# Patient Record
Sex: Female | Born: 1945 | Race: Black or African American | Hispanic: No | Smoking: Never smoker
Health system: Southern US, Community
[De-identification: ages and names within clinical notes are randomized; demographics above are authoritative.]

## PROBLEM LIST (undated history)

## (undated) DIAGNOSIS — Z973 Presence of spectacles and contact lenses: Secondary | ICD-10-CM

## (undated) DIAGNOSIS — M810 Age-related osteoporosis without current pathological fracture: Secondary | ICD-10-CM

## (undated) DIAGNOSIS — E785 Hyperlipidemia, unspecified: Secondary | ICD-10-CM

## (undated) DIAGNOSIS — C801 Malignant (primary) neoplasm, unspecified: Secondary | ICD-10-CM

## (undated) HISTORY — PX: COLONOSCOPY: SHX174

## (undated) HISTORY — DX: Hyperlipidemia, unspecified: E78.5

## (undated) HISTORY — DX: Age-related osteoporosis without current pathological fracture: M81.0

---

## 1983-07-03 HISTORY — PX: BREAST SURGERY: SHX581

## 2003-07-03 HISTORY — PX: PATELLA FRACTURE SURGERY: SHX735

## 2006-07-02 HISTORY — PX: ORIF HIP FRACTURE: SHX2125

## 2013-06-19 ENCOUNTER — Encounter: Payer: Self-pay | Admitting: Nurse Practitioner

## 2013-08-06 ENCOUNTER — Ambulatory Visit (INDEPENDENT_AMBULATORY_CARE_PROVIDER_SITE_OTHER): Payer: BC Managed Care – PPO | Admitting: General Surgery

## 2013-08-27 ENCOUNTER — Ambulatory Visit (INDEPENDENT_AMBULATORY_CARE_PROVIDER_SITE_OTHER): Payer: BC Managed Care – PPO | Admitting: General Surgery

## 2013-08-28 ENCOUNTER — Ambulatory Visit (INDEPENDENT_AMBULATORY_CARE_PROVIDER_SITE_OTHER): Payer: BC Managed Care – PPO | Admitting: General Surgery

## 2013-11-16 ENCOUNTER — Other Ambulatory Visit: Payer: Self-pay | Admitting: Family Medicine

## 2013-11-16 DIAGNOSIS — R921 Mammographic calcification found on diagnostic imaging of breast: Secondary | ICD-10-CM

## 2013-12-09 ENCOUNTER — Ambulatory Visit
Admission: RE | Admit: 2013-12-09 | Discharge: 2013-12-09 | Disposition: A | Discharge status: Home / Self Care | Payer: BC Managed Care – PPO | Source: Ambulatory Visit | Attending: Family Medicine | Admitting: Family Medicine

## 2013-12-09 DIAGNOSIS — R921 Mammographic calcification found on diagnostic imaging of breast: Secondary | ICD-10-CM

## 2013-12-10 ENCOUNTER — Other Ambulatory Visit: Payer: Self-pay | Admitting: Family Medicine

## 2013-12-10 DIAGNOSIS — N6321 Unspecified lump in the left breast, upper outer quadrant: Secondary | ICD-10-CM

## 2013-12-18 ENCOUNTER — Ambulatory Visit
Admission: RE | Admit: 2013-12-18 | Discharge: 2013-12-18 | Disposition: A | Discharge status: Home / Self Care | Payer: BC Managed Care – PPO | Source: Ambulatory Visit | Attending: Family Medicine | Admitting: Family Medicine

## 2013-12-18 ENCOUNTER — Encounter (INDEPENDENT_AMBULATORY_CARE_PROVIDER_SITE_OTHER): Payer: Self-pay

## 2013-12-18 ENCOUNTER — Other Ambulatory Visit: Payer: Self-pay | Admitting: Family Medicine

## 2013-12-18 DIAGNOSIS — N6321 Unspecified lump in the left breast, upper outer quadrant: Secondary | ICD-10-CM

## 2013-12-30 ENCOUNTER — Other Ambulatory Visit: Payer: BC Managed Care – PPO

## 2013-12-31 ENCOUNTER — Ambulatory Visit
Admission: RE | Admit: 2013-12-31 | Discharge: 2013-12-31 | Disposition: A | Discharge status: Home / Self Care | Payer: BC Managed Care – PPO | Source: Ambulatory Visit | Attending: Family Medicine | Admitting: Family Medicine

## 2013-12-31 DIAGNOSIS — N6321 Unspecified lump in the left breast, upper outer quadrant: Secondary | ICD-10-CM

## 2014-01-06 ENCOUNTER — Ambulatory Visit (INDEPENDENT_AMBULATORY_CARE_PROVIDER_SITE_OTHER): Payer: BC Managed Care – PPO | Admitting: General Surgery

## 2014-01-06 ENCOUNTER — Encounter (INDEPENDENT_AMBULATORY_CARE_PROVIDER_SITE_OTHER): Payer: Self-pay | Admitting: General Surgery

## 2014-01-06 VITALS — BP 126/80 | HR 85 | Temp 97.9°F | Ht 64.0 in | Wt 148.0 lb

## 2014-01-06 DIAGNOSIS — R928 Other abnormal and inconclusive findings on diagnostic imaging of breast: Secondary | ICD-10-CM

## 2014-01-06 DIAGNOSIS — D242 Benign neoplasm of left breast: Secondary | ICD-10-CM

## 2014-01-06 DIAGNOSIS — D249 Benign neoplasm of unspecified breast: Secondary | ICD-10-CM

## 2014-01-06 NOTE — Progress Notes (Signed)
Chief complaint: Abnormal mammogram left breast the previous biopsy left breast showing papilloma  History: Patient is a 68 year old female referred by Dr. Luberta Robertson. In January of this year at [facility] she had an abnormal mammogram and ultrasound and underwent an ultrasound-guided core biopsy which revealed a papilloma. Surgical excision was recommended at that time but declined by the patient. She recently presented to the breast center for a second opinion. She has a history of breast cancer family and her sister at age 13. She has undergone previous benign left breast biopsy many years ago.  The patient subsequently underwent ultrasound of the left breast showing a clip within the subareolar portion of the left breast related to the previous biopsy. However also noted was a near regular mass located in the middle third of the breast subareolar portion measuring 1.3 cm in size and approximately 2 cm posterior to the previously placed clip. Ultrasound guided core biopsy of this mass was recommended and performed. Pathology revealed benign breast tissue negative for malignancy with calcifications. However post biopsy mammogram shows that the biopsy clip coil shaped is located 1.5 cm anterior to the noted area of density. There is concern that the area was not adequately sampled.  Past Medical History  Diagnosis Date  . Hyperlipidemia   . Osteoporosis    Past Surgical History  Procedure Laterality Date  . Knee arthrocentesis    . Hip adductor tenotomy     Current Outpatient Prescriptions  Medication Sig Dispense Refill  . CRESTOR 20 MG tablet        No current facility-administered medications for this visit.   No Known Allergies History  Substance Use Topics  . Smoking status: Never Smoker   . Smokeless tobacco: Not on file  . Alcohol Use: No   Exam: BP 126/80  Pulse 85  Temp(Src) 97.9 F (36.6 C)  Ht 5\' 4"  (1.626 m)  Wt 148 lb (67.132 kg)  BMI 25.39 kg/m2 General:  Well-developed Afro-American female no distress Skin: No rash or infection HEENT: No pelvic masses. No icterus.  Lungs: Clear good breath sounds without wheezing Cardiac: Regular rate and rhythm without murmurs Lymph nodes: No cervical, supraclavicular or axillary nodes palpable Breasts: Healed incision lower left breast. Slight bruising at the lateral core biopsy site left breast. No palpable masses or nipple inversion or nipple discharge.  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 Followup imaging also shows a suspicious area of density 2 cm posterior to this that may not have been adequately sampled based on post biopsy clip placement. I recommend proceeding with needle localized lumpectomy with bracketing to excise both of these areas for diagnosis. We discussed the indication for the procedure and its nature in recovery as well as risks of anesthetic complications, bleeding, infection and possible pathology findings. We discussed that if we did find malignancy that this surgery would likely not be definitive. She understands and agrees and all her questions were answered. We will schedule her convenience.

## 2014-01-06 NOTE — Patient Instructions (Signed)
We will call in the next one to 2 days to schedule your breast lumpectomy.Marland Kitchen

## 2014-01-11 ENCOUNTER — Other Ambulatory Visit (INDEPENDENT_AMBULATORY_CARE_PROVIDER_SITE_OTHER): Payer: Self-pay | Admitting: General Surgery

## 2014-01-11 DIAGNOSIS — R928 Other abnormal and inconclusive findings on diagnostic imaging of breast: Secondary | ICD-10-CM

## 2014-02-02 ENCOUNTER — Encounter (HOSPITAL_BASED_OUTPATIENT_CLINIC_OR_DEPARTMENT_OTHER): Payer: Self-pay | Admitting: *Deleted

## 2014-02-02 NOTE — Progress Notes (Signed)
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

## 2014-02-08 ENCOUNTER — Ambulatory Visit (HOSPITAL_BASED_OUTPATIENT_CLINIC_OR_DEPARTMENT_OTHER)
Admission: RE | Admit: 2014-02-08 | Discharge: 2014-02-08 | Disposition: A | Discharge status: Home / Self Care | Payer: BC Managed Care – PPO | Source: Ambulatory Visit | Attending: General Surgery | Admitting: General Surgery

## 2014-02-08 ENCOUNTER — Ambulatory Visit
Admission: RE | Admit: 2014-02-08 | Discharge: 2014-02-08 | Disposition: A | Discharge status: Home / Self Care | Payer: BC Managed Care – PPO | Source: Ambulatory Visit | Attending: General Surgery | Admitting: General Surgery

## 2014-02-08 ENCOUNTER — Ambulatory Visit (HOSPITAL_BASED_OUTPATIENT_CLINIC_OR_DEPARTMENT_OTHER): Payer: BC Managed Care – PPO | Admitting: Anesthesiology

## 2014-02-08 ENCOUNTER — Encounter (HOSPITAL_BASED_OUTPATIENT_CLINIC_OR_DEPARTMENT_OTHER)
Admission: RE | Disposition: A | Discharge status: Home / Self Care | Payer: Self-pay | Source: Ambulatory Visit | Attending: General Surgery

## 2014-02-08 ENCOUNTER — Encounter (HOSPITAL_BASED_OUTPATIENT_CLINIC_OR_DEPARTMENT_OTHER): Payer: Self-pay | Admitting: Anesthesiology

## 2014-02-08 ENCOUNTER — Encounter (HOSPITAL_BASED_OUTPATIENT_CLINIC_OR_DEPARTMENT_OTHER): Payer: BC Managed Care – PPO | Admitting: Anesthesiology

## 2014-02-08 DIAGNOSIS — D249 Benign neoplasm of unspecified breast: Secondary | ICD-10-CM | POA: Insufficient documentation

## 2014-02-08 DIAGNOSIS — R928 Other abnormal and inconclusive findings on diagnostic imaging of breast: Secondary | ICD-10-CM

## 2014-02-08 DIAGNOSIS — E785 Hyperlipidemia, unspecified: Secondary | ICD-10-CM | POA: Insufficient documentation

## 2014-02-08 DIAGNOSIS — D242 Benign neoplasm of left breast: Secondary | ICD-10-CM

## 2014-02-08 DIAGNOSIS — M810 Age-related osteoporosis without current pathological fracture: Secondary | ICD-10-CM | POA: Insufficient documentation

## 2014-02-08 DIAGNOSIS — Z79899 Other long term (current) drug therapy: Secondary | ICD-10-CM | POA: Diagnosis not present

## 2014-02-08 DIAGNOSIS — C50919 Malignant neoplasm of unspecified site of unspecified female breast: Secondary | ICD-10-CM

## 2014-02-08 HISTORY — PX: BREAST LUMPECTOMY WITH NEEDLE LOCALIZATION: SHX5759

## 2014-02-08 HISTORY — DX: Presence of spectacles and contact lenses: Z97.3

## 2014-02-08 LAB — POCT HEMOGLOBIN-HEMACUE: HEMOGLOBIN: 11.1 g/dL — AB (ref 12.0–15.0)

## 2014-02-08 SURGERY — BREAST LUMPECTOMY WITH NEEDLE LOCALIZATION
Anesthesia: General | Site: Breast | Laterality: Left

## 2014-02-08 MED ORDER — OXYCODONE HCL 5 MG PO TABS: 5.0000 mg | ORAL_TABLET | Freq: Four times a day (QID) | ORAL | Status: DC | PRN

## 2014-02-08 MED ORDER — MIDAZOLAM HCL 2 MG/2ML IJ SOLN
INTRAMUSCULAR | Status: AC
  Filled 2014-02-08: qty 2

## 2014-02-08 MED ORDER — SCOPOLAMINE 1 MG/3DAYS TD PT72
1.0000 | MEDICATED_PATCH | Freq: Once | TRANSDERMAL | Status: DC
  Administered 2014-02-08: 1.5 mg via TRANSDERMAL

## 2014-02-08 MED ORDER — BUPIVACAINE-EPINEPHRINE (PF) 0.5% -1:200000 IJ SOLN
INTRAMUSCULAR | Status: AC
  Filled 2014-02-08: qty 30

## 2014-02-08 MED ORDER — PROPOFOL 10 MG/ML IV BOLUS
INTRAVENOUS | Status: DC | PRN
  Administered 2014-02-08: 120 mg via INTRAVENOUS

## 2014-02-08 MED ORDER — DEXAMETHASONE SODIUM PHOSPHATE 4 MG/ML IJ SOLN
INTRAMUSCULAR | Status: DC | PRN
  Administered 2014-02-08: 10 mg via INTRAVENOUS

## 2014-02-08 MED ORDER — FENTANYL CITRATE 0.05 MG/ML IJ SOLN
INTRAMUSCULAR | Status: AC
  Filled 2014-02-08: qty 4

## 2014-02-08 MED ORDER — SCOPOLAMINE 1 MG/3DAYS TD PT72
MEDICATED_PATCH | TRANSDERMAL | Status: AC
  Filled 2014-02-08: qty 1

## 2014-02-08 MED ORDER — MIDAZOLAM HCL 2 MG/2ML IJ SOLN: 1.0000 mg | INTRAMUSCULAR | Status: DC | PRN

## 2014-02-08 MED ORDER — HYDROMORPHONE HCL PF 1 MG/ML IJ SOLN
0.2500 mg | INTRAMUSCULAR | Status: DC | PRN
Start: 2014-02-08 — End: 2014-02-08

## 2014-02-08 MED ORDER — FENTANYL CITRATE 0.05 MG/ML IJ SOLN: 50.0000 ug | INTRAMUSCULAR | Status: DC | PRN

## 2014-02-08 MED ORDER — LIDOCAINE HCL (PF) 1 % IJ SOLN
INTRAMUSCULAR | Status: AC
  Filled 2014-02-08: qty 30

## 2014-02-08 MED ORDER — LACTATED RINGERS IV SOLN
INTRAVENOUS | Status: DC
  Administered 2014-02-08: 11:00:00 via INTRAVENOUS

## 2014-02-08 MED ORDER — LIDOCAINE HCL (CARDIAC) 20 MG/ML IV SOLN
INTRAVENOUS | Status: DC | PRN
  Administered 2014-02-08: 60 mg via INTRAVENOUS

## 2014-02-08 MED ORDER — CEFAZOLIN SODIUM-DEXTROSE 2-3 GM-% IV SOLR
INTRAVENOUS | Status: AC
  Filled 2014-02-08: qty 50

## 2014-02-08 MED ORDER — MIDAZOLAM HCL 5 MG/5ML IJ SOLN
INTRAMUSCULAR | Status: DC | PRN
  Administered 2014-02-08: 1 mg via INTRAVENOUS

## 2014-02-08 MED ORDER — BUPIVACAINE-EPINEPHRINE 0.5% -1:200000 IJ SOLN
INTRAMUSCULAR | Status: DC | PRN
Start: 2014-02-08 — End: 2014-02-08
  Administered 2014-02-08: 27 mL

## 2014-02-08 MED ORDER — CEFAZOLIN SODIUM-DEXTROSE 2-3 GM-% IV SOLR
2.0000 g | INTRAVENOUS | Status: AC
  Administered 2014-02-08: 2 g via INTRAVENOUS

## 2014-02-08 MED ORDER — FENTANYL CITRATE 0.05 MG/ML IJ SOLN
INTRAMUSCULAR | Status: DC | PRN
  Administered 2014-02-08: 25 ug via INTRAVENOUS
  Administered 2014-02-08: 50 ug via INTRAVENOUS
  Administered 2014-02-08: 25 ug via INTRAVENOUS

## 2014-02-08 MED ORDER — ONDANSETRON HCL 4 MG/2ML IJ SOLN
INTRAMUSCULAR | Status: DC | PRN
  Administered 2014-02-08: 4 mg via INTRAVENOUS

## 2014-02-08 SURGICAL SUPPLY — 48 items
BINDER BREAST LRG (GAUZE/BANDAGES/DRESSINGS) ×2 IMPLANT
BLADE SURG 10 STRL SS (BLADE) IMPLANT
BLADE SURG 15 STRL LF DISP TIS (BLADE) ×1 IMPLANT
BLADE SURG 15 STRL SS (BLADE) ×1
CANISTER SUCT 1200ML W/VALVE (MISCELLANEOUS) IMPLANT
CHLORAPREP W/TINT 26ML (MISCELLANEOUS) ×2 IMPLANT
CLIP TI MEDIUM 6 (CLIP) IMPLANT
CLIP TI WIDE RED SMALL 6 (CLIP) IMPLANT
COVER MAYO STAND STRL (DRAPES) ×2 IMPLANT
COVER TABLE BACK 60X90 (DRAPES) ×2 IMPLANT
DERMABOND ADVANCED (GAUZE/BANDAGES/DRESSINGS) ×1
DERMABOND ADVANCED .7 DNX12 (GAUZE/BANDAGES/DRESSINGS) ×1 IMPLANT
DEVICE DUBIN W/COMP PLATE 8390 (MISCELLANEOUS) ×2 IMPLANT
DRAPE PED LAPAROTOMY (DRAPES) ×2 IMPLANT
DRAPE UTILITY XL STRL (DRAPES) ×2 IMPLANT
ELECT COATED BLADE 2.86 ST (ELECTRODE) ×2 IMPLANT
ELECT REM PT RETURN 9FT ADLT (ELECTROSURGICAL) ×2
ELECTRODE REM PT RTRN 9FT ADLT (ELECTROSURGICAL) ×1 IMPLANT
GLOVE BIOGEL PI IND STRL 7.0 (GLOVE) ×1 IMPLANT
GLOVE BIOGEL PI IND STRL 8 (GLOVE) ×1 IMPLANT
GLOVE BIOGEL PI INDICATOR 7.0 (GLOVE) ×1
GLOVE BIOGEL PI INDICATOR 8 (GLOVE) ×1
GLOVE ECLIPSE 7.0 STRL STRAW (GLOVE) ×2 IMPLANT
GLOVE SS BIOGEL STRL SZ 7.5 (GLOVE) ×1 IMPLANT
GLOVE SUPERSENSE BIOGEL SZ 7.5 (GLOVE) ×1
GOWN STRL REUS W/ TWL LRG LVL3 (GOWN DISPOSABLE) ×1 IMPLANT
GOWN STRL REUS W/ TWL XL LVL3 (GOWN DISPOSABLE) ×1 IMPLANT
GOWN STRL REUS W/TWL LRG LVL3 (GOWN DISPOSABLE) ×1
GOWN STRL REUS W/TWL XL LVL3 (GOWN DISPOSABLE) ×1
KIT MARKER MARGIN INK (KITS) IMPLANT
NEEDLE HYPO 25X1 1.5 SAFETY (NEEDLE) ×2 IMPLANT
NS IRRIG 1000ML POUR BTL (IV SOLUTION) ×2 IMPLANT
PACK BASIN DAY SURGERY FS (CUSTOM PROCEDURE TRAY) ×2 IMPLANT
PENCIL BUTTON HOLSTER BLD 10FT (ELECTRODE) ×2 IMPLANT
SLEEVE SCD COMPRESS KNEE MED (MISCELLANEOUS) IMPLANT
STAPLER VISISTAT 35W (STAPLE) IMPLANT
SUT MON AB 3-0 SH 27 (SUTURE)
SUT MON AB 3-0 SH27 (SUTURE) IMPLANT
SUT MON AB 5-0 PS2 18 (SUTURE) ×2 IMPLANT
SUT SILK 3 0 SH 30 (SUTURE) IMPLANT
SUT VIC AB 4-0 BRD 54 (SUTURE) IMPLANT
SUT VICRYL 3-0 CR8 SH (SUTURE) ×2 IMPLANT
SYR BULB 3OZ (MISCELLANEOUS) IMPLANT
SYR CONTROL 10ML LL (SYRINGE) ×2 IMPLANT
TOWEL OR 17X24 6PK STRL BLUE (TOWEL DISPOSABLE) ×4 IMPLANT
TOWEL OR NON WOVEN STRL DISP B (DISPOSABLE) ×2 IMPLANT
TUBE CONNECTING 20X1/4 (TUBING) IMPLANT
YANKAUER SUCT BULB TIP NO VENT (SUCTIONS) IMPLANT

## 2014-02-08 NOTE — Transfer of Care (Signed)
Immediate Anesthesia Transfer of Care Note  Patient: Dana Burton  Procedure(s) Performed: Procedure(s): BREAST LUMPECTOMY WITH NEEDLE LOCALIZATION (Left)  Patient Location: PACU  Anesthesia Type:General  Level of Consciousness: awake and sedated  Airway & Oxygen Therapy: Patient Spontanous Breathing and Patient connected to face mask oxygen  Post-op Assessment: Report given to PACU RN and Post -op Vital signs reviewed and stable  Post vital signs: Reviewed and stable  Complications: No apparent anesthesia complications

## 2014-02-08 NOTE — Op Note (Signed)
Preoperative Diagnosis: papilloma left breast And abnormal left breast mammogram  Postoprative Diagnosis: papilloma left breast And abnormal left breast mammogram  Procedure: Procedure(s): BREAST LUMPECTOMY WITH NEEDLE LOCALIZATION   Surgeon: Excell Seltzer T   Assistants: None  Anesthesia:  General LMA anesthesia  Indications: Patient is a 68 year old female who on a recent screening mammogram was found to have a small mass behind the left nipple. A large core needle biopsy was recommended revealing papilloma. On subsequent followup she was also found to have a  New abnormal density in a couple centimeters posterior to this. Image guided biopsy was recommended and performed showing benign breast tissue this was felt to be discordant with the marker clip about a centimeter anterior to the abnormal density. These findings after consultation with the breast radiologist we have recommended proceeding with needle localized excisional biopsy bracketing the anterior papilloma and the posterior area of abnormal density. We discussed the indications for the procedure and its nature as well as risks of anesthetic complications, bleeding, infection and possible need for further surgery based on final pathology.  Procedure Detail:  Following accurate needle localization bracketing the anterior papilloma and posterior abnormal density the patient was brought to the operating room, placed in the supine position on the operating table and laryngeal mask anesthesia induced. The left breast was widely sterilely prepped and draped. She received preoperative IV antibiotics. Patient timeout was performed and the procedure verified. I made a curvilinear incision at the areolar border upper-outer quadrant of left breast dissection was then carried behind the nipple areolar complex staying just anterior to the anterior wire where there was a definite palpable abnormality from either a small mass or biopsy change just  at the tip of the wire. The dissection was deepened down around the tip of the wire. I then raised a skin and subcutaneous flap out laterally to the more posterior wire which was encountered. This wire was then dissected down into breast tissue until the thickened area of the wire was in exposed. I then deepened the dissection from the anterior wire down around the tip and shaft of the posterior wire on either side with cautery encompassing all tissue between. There was definite firm tissue just anterior to the posteriorly placed wire. The specimen was removed. It was oriented with sutures. The specimen mammogram showed both intact wires and marking clips within the specimen. There was a little more firm tissue on the posterior wall of the cavity which I then excised with cautery and sent as a separate specimen. The wound was thoroughly irrigated and hemostasis obtained with cautery. The breast tissue and subcutaneous tissue was closed with interrupted 3-0 Vicryl and the skin closed with subcuticular 4-0 Monocryl and Dermabond. Sponge needle and instrument counts were correct.    Findings: As above  Estimated Blood Loss:  Minimal         Drains: none  Blood Given: none          Specimens: #1 left breast lumpectomy(papilloma anterior and abnormal density posterior)      #2 further posterior tissue        Complications:  * No complications entered in OR log *         Disposition: PACU - hemodynamically stable.         Condition: stable

## 2014-02-08 NOTE — H&P (Signed)
  Chief complaint: Abnormal mammogram left breast the previous biopsy left breast showing papilloma  History: Patient is a 68 year old female referred by Dr. Luberta Robertson. In January of this year at [facility] she had an abnormal mammogram and ultrasound and underwent an ultrasound-guided core biopsy which revealed a papilloma. Surgical excision was recommended at that time but declined by the patient. She recently presented to the breast center for a second opinion. She has a history of breast cancer family and her sister at age 33. She has undergone previous benign left breast biopsy many years ago.  The patient subsequently underwent ultrasound of the left breast showing a clip within the subareolar portion of the left breast related to the previous biopsy. However also noted was a near regular mass located in the middle third of the breast subareolar portion measuring 1.3 cm in size and approximately 2 cm posterior to the previously placed clip. Ultrasound guided core biopsy of this mass was recommended and performed. Pathology revealed benign breast tissue negative for malignancy with calcifications. However post biopsy mammogram shows that the biopsy clip coil shaped is located 1.5 cm anterior to the noted area of density. There is concern that the area was not adequately sampled.  Past Medical History   Diagnosis  Date   .  Hyperlipidemia    .  Osteoporosis     Past Surgical History   Procedure  Laterality  Date   .  Knee arthrocentesis     .  Hip adductor tenotomy      Current Outpatient Prescriptions   Medication  Sig  Dispense  Refill   .  CRESTOR 20 MG tablet       No current facility-administered medications for this visit.   No Known Allergies  History   Substance Use Topics   .  Smoking status:  Never Smoker   .  Smokeless tobacco:  Not on file   .  Alcohol Use:  No   Exam:  BP 126/80  Pulse 85  Temp(Src) 97.9 F (36.6 C)  Ht 5\' 4"  (1.626 m)  Wt 148 lb (67.132 kg)  BMI 25.39  kg/m2  General: Well-developed Afro-American female no distress  Skin: No rash or infection  HEENT: No pelvic masses. No icterus.  Lungs: Clear good breath sounds without wheezing  Cardiac: Regular rate and rhythm without murmurs  Lymph nodes: No cervical, supraclavicular or axillary nodes palpable  Breasts: Healed incision lower left breast. Slight bruising at the lateral core biopsy site left breast. No palpable masses or nipple inversion or nipple discharge.  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 Followup imaging also shows a suspicious area of density 2 cm posterior to this that may not have been adequately sampled based on post biopsy clip placement. I recommend proceeding with needle localized lumpectomy with bracketing to excise both of these areas for diagnosis. We discussed the indication for the procedure and its nature in recovery as well as risks of anesthetic complications, bleeding, infection and possible pathology findings. We discussed that if we did find malignancy that this surgery would likely not be definitive. She understands and agrees and all her questions were answered. We will schedule her convenience.  Edward Jolly MD, FACS  02/08/2014, 11:09 AM

## 2014-02-08 NOTE — Anesthesia Procedure Notes (Signed)
Procedure Name: LMA Insertion Performed by: Terrance Mass Pre-anesthesia Checklist: Patient identified, Timeout performed, Emergency Drugs available, Suction available and Patient being monitored Patient Re-evaluated:Patient Re-evaluated prior to inductionOxygen Delivery Method: Circle system utilized Preoxygenation: Pre-oxygenation with 100% oxygen Intubation Type: IV induction Ventilation: Mask ventilation without difficulty LMA: LMA inserted LMA Size: 4.0 Number of attempts: 1 Tube secured with: Tape Dental Injury: Teeth and Oropharynx as per pre-operative assessment

## 2014-02-08 NOTE — Anesthesia Postprocedure Evaluation (Signed)
  Anesthesia Post-op Note  Patient: Dana Burton  Procedure(s) Performed: Procedure(s): BREAST LUMPECTOMY WITH NEEDLE LOCALIZATION (Left)  Patient Location: PACU  Anesthesia Type:General  Level of Consciousness: awake and alert   Airway and Oxygen Therapy: Patient Spontanous Breathing  Post-op Pain: mild  Post-op Assessment: Post-op Vital signs reviewed, Patient's Cardiovascular Status Stable and Respiratory Function Stable  Post-op Vital Signs: Reviewed  Filed Vitals:   02/08/14 1345  BP: 147/81  Pulse: 78  Temp:   Resp: 16    Complications: No apparent anesthesia complications

## 2014-02-08 NOTE — Anesthesia Preprocedure Evaluation (Addendum)
Anesthesia Evaluation  Patient identified by MRN, date of birth, ID band Patient awake    Reviewed: Allergy & Precautions, H&P , NPO status , Patient's Chart, lab work & pertinent test results  Airway Mallampati: II  TM Distance: >3 FB Neck ROM: Full    Dental no notable dental hx. (+) Teeth Intact, Dental Advisory Given   Pulmonary neg pulmonary ROS,  breath sounds clear to auscultation  Pulmonary exam normal       Cardiovascular negative cardio ROS  Rhythm:Regular Rate:Normal     Neuro/Psych negative neurological ROS  negative psych ROS   GI/Hepatic negative GI ROS, Neg liver ROS,   Endo/Other  negative endocrine ROS  Renal/GU negative Renal ROS  negative genitourinary   Musculoskeletal   Abdominal   Peds  Hematology negative hematology ROS (+)   Anesthesia Other Findings   Reproductive/Obstetrics negative OB ROS                            Anesthesia Physical Anesthesia Plan  ASA: I  Anesthesia Plan: General   Post-op Pain Management:    Induction: Intravenous  Airway Management Planned: LMA  Additional Equipment:   Intra-op Plan:   Post-operative Plan: Extubation in OR  Informed Consent: I have reviewed the patients History and Physical, chart, labs and discussed the procedure including the risks, benefits and alternatives for the proposed anesthesia with the patient or authorized representative who has indicated his/her understanding and acceptance.   Dental advisory given  Plan Discussed with: CRNA  Anesthesia Plan Comments:         Anesthesia Quick Evaluation  

## 2014-02-08 NOTE — Discharge Instructions (Signed)
[facility] °Office Phone Number [phone] ° °BREAST BIOPSY/ PARTIAL MASTECTOMY: POST OP INSTRUCTIONS ° °Always review your discharge instruction sheet given to you by the facility where your surgery was performed. ° °IF YOU HAVE DISABILITY OR FAMILY LEAVE FORMS, YOU MUST BRING THEM TO THE OFFICE FOR PROCESSING.  DO NOT GIVE THEM TO YOUR DOCTOR. ° °1. A prescription for pain medication may be given to you upon discharge.  Take your pain medication as prescribed, if needed.  If narcotic pain medicine is not needed, then you may take acetaminophen (Tylenol) or ibuprofen (Advil) as needed. °2. Take your usually prescribed medications unless otherwise directed °3. If you need a refill on your pain medication, please contact your pharmacy.  They will contact our office to request authorization.  Prescriptions will not be filled after 5pm or on week-ends. °4. You should eat very light the first 24 hours after surgery, such as soup, crackers, pudding, etc.  Resume your normal diet the day after surgery. °5. Most patients will experience some swelling and bruising in the breast.  Ice packs and a good support bra will help.  Swelling and bruising can take several days to resolve.  °6. It is common to experience some constipation if taking pain medication after surgery.  Increasing fluid intake and taking a stool softener will usually help or prevent this problem from occurring.  A mild laxative (Milk of Magnesia or Miralax) should be taken according to package directions if there are no bowel movements after 48 hours. °7. Unless discharge instructions indicate otherwise, you may remove your bandages 24-48 hours after surgery, and you may shower at that time.  You may have steri-strips (small skin tapes) in place directly over the incision.  These strips should be left on the skin for 7-10 days.  If your surgeon used skin glue on the incision, you may shower in 24 hours.  The glue will flake off over the  next 2-3 weeks.  Any sutures or staples will be removed at the office during your follow-up visit. °8. ACTIVITIES:  You may resume regular daily activities (gradually increasing) beginning the next day.  Wearing a good support bra or sports bra minimizes pain and swelling.  You may have sexual intercourse when it is comfortable. °a. You may drive when you no longer are taking prescription pain medication, you can comfortably wear a seatbelt, and you can safely maneuver your car and apply brakes. °b. RETURN TO WORK:  ______________________________________________________________________________________ °9. You should see your doctor in the office for a follow-up appointment approximately two weeks after your surgery.  Your doctor’s nurse will typically make your follow-up appointment when she calls you with your pathology report.  Expect your pathology report 2-3 business days after your surgery.  You may call to check if you do not hear from us after three days. °10. OTHER INSTRUCTIONS: _______________________________________________________________________________________________ _____________________________________________________________________________________________________________________________________ °_____________________________________________________________________________________________________________________________________ °_____________________________________________________________________________________________________________________________________ ° °WHEN TO CALL YOUR DOCTOR: °1. Fever over 101.0 °2. Nausea and/or vomiting. °3. Extreme swelling or bruising. °4. Continued bleeding from incision. °5. Increased pain, redness, or drainage from the incision. ° °The clinic staff is available to answer your questions during regular business hours.  Please don’t hesitate to call and ask to speak to one of the nurses for clinical concerns.  If you have a medical emergency, go to the nearest  emergency room or call 911.  A surgeon from [facility] is always on call at the hospital. ° °For further questions, please visit [email or web address]  ° ° °  Post Anesthesia Home Care Instructions ° °Activity: °Get plenty of rest for the remainder of the day. A responsible adult should stay with you for 24 hours following the procedure.  °For the next 24 hours, DO NOT: °-Drive a car °-Operate machinery °-Drink alcoholic beverages °-Take any medication unless instructed by your physician °-Make any legal decisions or sign important papers. ° °Meals: °Start with liquid foods such as gelatin or soup. Progress to regular foods as tolerated. Avoid greasy, spicy, heavy foods. If nausea and/or vomiting occur, drink only clear liquids until the nausea and/or vomiting subsides. Call your physician if vomiting continues. ° °Special Instructions/Symptoms: °Your throat may feel dry or sore from the anesthesia or the breathing tube placed in your throat during surgery. If this causes discomfort, gargle with warm salt water. The discomfort should disappear within 24 hours. ° °

## 2014-02-10 ENCOUNTER — Telehealth (INDEPENDENT_AMBULATORY_CARE_PROVIDER_SITE_OTHER): Payer: Self-pay | Admitting: General Surgery

## 2014-02-10 ENCOUNTER — Encounter (HOSPITAL_BASED_OUTPATIENT_CLINIC_OR_DEPARTMENT_OTHER): Payer: Self-pay | Admitting: General Surgery

## 2014-02-10 NOTE — Telephone Encounter (Signed)
Called the patient and discussed results of excisional breast biopsy and arranged short-term appointment in the office.

## 2014-02-12 ENCOUNTER — Ambulatory Visit (INDEPENDENT_AMBULATORY_CARE_PROVIDER_SITE_OTHER): Payer: BC Managed Care – PPO | Admitting: General Surgery

## 2014-02-12 DIAGNOSIS — C50912 Malignant neoplasm of unspecified site of left female breast: Secondary | ICD-10-CM

## 2014-02-12 DIAGNOSIS — C50919 Malignant neoplasm of unspecified site of unspecified female breast: Secondary | ICD-10-CM

## 2014-02-12 NOTE — Progress Notes (Signed)
Chief complaint followup excisional breast biopsy, new diagnosis breast cancer  History: Patient returns following her needle localized excisional left breast biopsy for previous core needle biopsy showing papilloma and subsequent attempt at second core biopsy for an approximately 1.5 cm area of increased density posteriorly in the breast with discordant biopsy. Pathology has returned showing invasive ductal carcinoma spanning approximately 1.5 cm. This was done as a diagnostic excisional biopsy. Margins are broadly positive. The patient did have very dense breast tissue at the time of surgery. I discussed the diagnosis in detail with the patient today. We discussed treatment of breast cancer in general including surgery, radiation, hormonal therapy and chemotherapy. We discussed that she will need further surgery, either lumpectomy for negative margins or total mastectomy with sentinel lymph node biopsy. Based on her imaging I think lumpectomy is feasible. She would prefer breast conservation. We discussed that positive margins might indicate further surgery up to and including mastectomy. We discussed that systemic therapy would be based on her final pathologic staging and prognostic panel which has not yet complete.  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 Diagnostic panel pending. The patient would prefer breast conservation. 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 She is considering going they are for her cancer treatment so he can be available. She will make this decision in the next couple of days and we will forward records when she gets [place] a contact if she decides to go there.

## 2014-02-12 NOTE — Patient Instructions (Signed)
Call whenever you have made your decision regarding treatment location

## 2014-02-18 ENCOUNTER — Encounter (INDEPENDENT_AMBULATORY_CARE_PROVIDER_SITE_OTHER): Payer: BC Managed Care – PPO | Admitting: General Surgery

## 2019-01-08 ENCOUNTER — Other Ambulatory Visit (HOSPITAL_COMMUNITY): Payer: Self-pay | Admitting: Family Medicine

## 2019-01-08 DIAGNOSIS — R011 Cardiac murmur, unspecified: Secondary | ICD-10-CM

## 2019-01-14 ENCOUNTER — Ambulatory Visit (HOSPITAL_COMMUNITY): Payer: Medicare Other | Attending: Cardiology

## 2019-01-14 ENCOUNTER — Other Ambulatory Visit: Payer: Self-pay

## 2019-01-14 DIAGNOSIS — R011 Cardiac murmur, unspecified: Secondary | ICD-10-CM | POA: Diagnosis not present

## 2019-02-08 ENCOUNTER — Emergency Department (HOSPITAL_COMMUNITY): Payer: Medicare Other

## 2019-02-08 ENCOUNTER — Emergency Department (HOSPITAL_COMMUNITY)
Admission: EM | Admit: 2019-02-08 | Discharge: 2019-02-08 | Disposition: A | Discharge status: Home / Self Care | Payer: Medicare Other | Attending: Emergency Medicine | Admitting: Emergency Medicine

## 2019-02-08 ENCOUNTER — Other Ambulatory Visit: Payer: Self-pay

## 2019-02-08 ENCOUNTER — Encounter (HOSPITAL_COMMUNITY): Payer: Self-pay | Admitting: Emergency Medicine

## 2019-02-08 DIAGNOSIS — W228XXA Striking against or struck by other objects, initial encounter: Secondary | ICD-10-CM | POA: Diagnosis not present

## 2019-02-08 DIAGNOSIS — S9031XA Contusion of right foot, initial encounter: Secondary | ICD-10-CM | POA: Diagnosis not present

## 2019-02-08 DIAGNOSIS — Z79899 Other long term (current) drug therapy: Secondary | ICD-10-CM | POA: Insufficient documentation

## 2019-02-08 DIAGNOSIS — Y999 Unspecified external cause status: Secondary | ICD-10-CM | POA: Insufficient documentation

## 2019-02-08 DIAGNOSIS — Y929 Unspecified place or not applicable: Secondary | ICD-10-CM | POA: Diagnosis not present

## 2019-02-08 DIAGNOSIS — Y9389 Activity, other specified: Secondary | ICD-10-CM | POA: Insufficient documentation

## 2019-02-08 DIAGNOSIS — S99921A Unspecified injury of right foot, initial encounter: Secondary | ICD-10-CM | POA: Diagnosis present

## 2019-02-08 HISTORY — DX: Malignant (primary) neoplasm, unspecified: C80.1

## 2019-02-08 NOTE — Discharge Instructions (Signed)
Rest - please stay off the right foot as much as possible for the next 1-2 days. Use crutches as needed Ice - ice for 20 minutes at a time, several times a day Compression - wear ACE wrap to provide support Elevate - elevate right leg above level of heart Take Tylenol or Ibuprofen for pain

## 2019-02-08 NOTE — ED Notes (Signed)
Bruised swollen rt lateral foot  Ice pack given

## 2019-02-08 NOTE — ED Triage Notes (Signed)
Pt states a table fell on her R foot 2 hrs ago.  C/o pain and bruising to R foot.

## 2019-02-08 NOTE — ED Provider Notes (Signed)
[facility] EMERGENCY DEPARTMENT Provider Note   CSN: [id number] Arrival date & time: 02/08/19  1613     History   Chief Complaint Chief Complaint  Patient presents with  . Foot Pain    HPI Dana Burton is a 73 y.o. female who presents with R foot pain. PMH significant for osteoporosis. She states that she was lifting a folding table and dropped it on her foot around noon today. Initially she was fine and there was a small amount of bruising however over the next several hours it became more painful and swollen. The pain is over the top of the foot. It is difficult to walk. It's better with rest and elevation. She take Aspirin but no blood thinners.     HPI  Past Medical History:  Diagnosis Date  . Cancer ([place])   . Hyperlipidemia   . Osteoporosis   . Wears contact lenses    to bring glasses    There are no active problems to display for this patient.   Past Surgical History:  Procedure Laterality Date  . BREAST LUMPECTOMY WITH NEEDLE LOCALIZATION Left 02/08/2014   Procedure: BREAST LUMPECTOMY WITH NEEDLE LOCALIZATION;  Surgeon: Edward Jolly, MD;  Location: [facility];  Service: General;  Laterality: Left;  . BREAST SURGERY  1985   lt br bx-neg  . COLONOSCOPY    . ORIF HIP FRACTURE  2008   total hip post fx-right  . PATELLA FRACTURE SURGERY  2005   rt     OB History   No obstetric history on file.      Home Medications    Prior to Admission medications   Medication Sig Start Date End Date Taking? Authorizing Provider  calcium citrate-vitamin D (CITRACAL+D) 315-200 MG-UNIT per tablet Take 1 tablet by mouth daily.    [provider]  CRESTOR 20 MG tablet  01/01/14   [provider]  Multiple Vitamins-Minerals (MULTIVITAMIN WITH MINERALS) tablet Take 1 tablet by mouth daily.    [provider]    Family History Family History  Problem Relation Age of Onset  . Hypertension Father      Social History Social History   Tobacco Use  . Smoking status: Never Smoker  Substance Use Topics  . Alcohol use: No    Comment: very rare  . Drug use: No     Allergies   Codeine   Review of Systems Review of Systems  Musculoskeletal: Positive for joint swelling.  Skin: Positive for color change. Negative for wound.     Physical Exam Updated Vital Signs BP 116/81 (BP Location: Right Arm)   Pulse 74   Temp 99 F (37.2 C) (Oral)   Resp 16   Ht 5\' 4"  (1.626 m)   Wt 65.8 kg   SpO2 96%   BMI 24.89 kg/m   Physical Exam Vitals signs and nursing note reviewed.  Constitutional:      General: She is not in acute distress.    Appearance: Normal appearance. She is well-developed. She is not ill-appearing.  HENT:     Head: Normocephalic and atraumatic.  Eyes:     General: No scleral icterus.       Right eye: No discharge.        Left eye: No discharge.     Conjunctiva/sclera: Conjunctivae normal.     Pupils: Pupils are equal, round, and reactive to light.  Neck:     Musculoskeletal: Normal range of motion.  Cardiovascular:     Rate and Rhythm: Normal rate.  Pulmonary:     Effort: Pulmonary effort is normal. No respiratory distress.  Abdominal:     General: There is no distension.  Musculoskeletal:     Comments: Ecchymosis and swelling over the lateral aspect of the dorsal R foot. 2+ DP pulse. Tenderness over the lateral foot. No leg or ankle tenderness.   Skin:    General: Skin is warm and dry.  Neurological:     Mental Status: She is alert and oriented to person, place, and time.  Psychiatric:        Behavior: Behavior normal.      ED Treatments / Results  Labs (all labs ordered are listed, but only abnormal results are displayed) Labs Reviewed - No data to display  EKG None  Radiology Dg Foot Complete Right  Result Date: 02/08/2019 CLINICAL DATA:  Pain EXAM: RIGHT FOOT COMPLETE - 3+ VIEW COMPARISON:  None. FINDINGS: There is no evidence of fracture  or dislocation. There is no evidence of arthropathy or other focal bone abnormality. Soft tissues are unremarkable. IMPRESSION: Negative. Electronically Signed   By: Constance Holster M.D.   On: 02/08/2019 17:30    Procedures Procedures (including critical care time)  Medications Ordered in ED Medications - No data to display   Initial Impression / Assessment and Plan / ED Course  I have reviewed the triage vital signs and the nursing notes.  Pertinent labs & imaging results that were available during my care of the patient were reviewed by me and considered in my medical decision making (see chart for details).  73 year old female presents with acute injury of the foot after dropping a table on it. She has bruising and swelling of the R foot. Xray is negative. RICE protocol discussed. She was given an ACE wrap and crutches and advised f/u with her doctor as needed.  Final Clinical Impressions(s) / ED Diagnoses   Final diagnoses:  Contusion of right foot, initial encounter    ED Discharge Orders    None       Recardo Evangelist, PA-C 02/08/19 2019    Lucrezia Starch, MD 02/09/19 1320

## 2019-02-08 NOTE — ED Notes (Signed)
To x-ray

## 2019-08-02 ENCOUNTER — Ambulatory Visit: Payer: Medicare Other

## 2019-08-07 ENCOUNTER — Ambulatory Visit: Payer: Medicare Other

## 2019-08-07 ENCOUNTER — Ambulatory Visit: Payer: Medicare HMO | Attending: Internal Medicine

## 2019-08-15 ENCOUNTER — Ambulatory Visit: Payer: Medicare HMO

## 2019-11-03 ENCOUNTER — Ambulatory Visit
Admission: RE | Admit: 2019-11-03 | Discharge: 2019-11-03 | Disposition: A | Discharge status: Home / Self Care | Payer: Medicare HMO | Source: Ambulatory Visit | Attending: Orthopaedic Surgery | Admitting: Orthopaedic Surgery

## 2019-11-03 ENCOUNTER — Other Ambulatory Visit: Payer: Self-pay | Admitting: Orthopaedic Surgery

## 2019-11-03 DIAGNOSIS — S52502A Unspecified fracture of the lower end of left radius, initial encounter for closed fracture: Secondary | ICD-10-CM

## 2020-12-13 ENCOUNTER — Other Ambulatory Visit: Payer: Self-pay

## 2020-12-13 ENCOUNTER — Encounter (HOSPITAL_BASED_OUTPATIENT_CLINIC_OR_DEPARTMENT_OTHER): Payer: Self-pay

## 2020-12-13 ENCOUNTER — Emergency Department (HOSPITAL_BASED_OUTPATIENT_CLINIC_OR_DEPARTMENT_OTHER): Payer: Medicare HMO

## 2020-12-13 ENCOUNTER — Emergency Department (HOSPITAL_BASED_OUTPATIENT_CLINIC_OR_DEPARTMENT_OTHER)
Admission: EM | Admit: 2020-12-13 | Discharge: 2020-12-13 | Disposition: A | Discharge status: Home / Self Care | Payer: Medicare HMO | Attending: Emergency Medicine | Admitting: Emergency Medicine

## 2020-12-13 DIAGNOSIS — R42 Dizziness and giddiness: Secondary | ICD-10-CM | POA: Insufficient documentation

## 2020-12-13 DIAGNOSIS — R1110 Vomiting, unspecified: Secondary | ICD-10-CM | POA: Insufficient documentation

## 2020-12-13 DIAGNOSIS — Z859 Personal history of malignant neoplasm, unspecified: Secondary | ICD-10-CM | POA: Diagnosis not present

## 2020-12-13 LAB — CBC WITH DIFFERENTIAL/PLATELET
Abs Immature Granulocytes: 0.02 10*3/uL (ref 0.00–0.07)
Basophils Absolute: 0 10*3/uL (ref 0.0–0.1)
Basophils Relative: 1 %
Eosinophils Absolute: 0 10*3/uL (ref 0.0–0.5)
Eosinophils Relative: 0 %
HCT: 40.8 % (ref 36.0–46.0)
Hemoglobin: 13.8 g/dL (ref 12.0–15.0)
Immature Granulocytes: 0 %
Lymphocytes Relative: 21 %
Lymphs Abs: 1.1 10*3/uL (ref 0.7–4.0)
MCH: 27.8 pg (ref 26.0–34.0)
MCHC: 33.8 g/dL (ref 30.0–36.0)
MCV: 82.3 fL (ref 80.0–100.0)
Monocytes Absolute: 0.3 10*3/uL (ref 0.1–1.0)
Monocytes Relative: 6 %
Neutro Abs: 3.9 10*3/uL (ref 1.7–7.7)
Neutrophils Relative %: 72 %
Platelets: 388 10*3/uL (ref 150–400)
RBC: 4.96 MIL/uL (ref 3.87–5.11)
RDW: 12.3 % (ref 11.5–15.5)
WBC: 5.4 10*3/uL (ref 4.0–10.5)
nRBC: 0 % (ref 0.0–0.2)

## 2020-12-13 LAB — BASIC METABOLIC PANEL
Anion gap: 11 (ref 5–15)
BUN: 7 mg/dL — ABNORMAL LOW (ref 8–23)
CO2: 22 mmol/L (ref 22–32)
Calcium: 9 mg/dL (ref 8.9–10.3)
Chloride: 104 mmol/L (ref 98–111)
Creatinine, Ser: 0.49 mg/dL (ref 0.44–1.00)
GFR, Estimated: >60 mL/min (ref >60)
Glucose, Bld: 166 mg/dL — ABNORMAL HIGH (ref 70–99)
Potassium: 3.5 mmol/L (ref 3.5–5.1)
Sodium: 137 mmol/L (ref 135–145)

## 2020-12-13 MED ORDER — PROMETHAZINE HCL 25 MG PO TABS
25.0000 mg | ORAL_TABLET | Freq: Once | ORAL | Status: AC
  Administered 2020-12-13: 25 mg via ORAL
  Filled 2020-12-13: qty 1

## 2020-12-13 MED ORDER — SODIUM CHLORIDE 0.9 % IV SOLN
12.5000 mg | Freq: Four times a day (QID) | INTRAVENOUS | Status: DC | PRN
  Filled 2020-12-13: qty 0.5

## 2020-12-13 MED ORDER — MECLIZINE HCL 25 MG PO TABS
25.0000 mg | ORAL_TABLET | Freq: Once | ORAL | Status: AC
  Administered 2020-12-13: 25 mg via ORAL
  Filled 2020-12-13: qty 1

## 2020-12-13 MED ORDER — MECLIZINE HCL 12.5 MG PO TABS: 12.5000 mg | ORAL_TABLET | Freq: Three times a day (TID) | ORAL | 0 refills | Status: AC | PRN

## 2020-12-13 MED ORDER — ONDANSETRON HCL 4 MG/2ML IJ SOLN
4.0000 mg | Freq: Once | INTRAMUSCULAR | Status: AC
  Administered 2020-12-13: 4 mg via INTRAVENOUS
  Filled 2020-12-13: qty 2

## 2020-12-13 MED ORDER — SODIUM CHLORIDE 0.9 % IV BOLUS
1000.0000 mL | Freq: Once | INTRAVENOUS | Status: AC
  Administered 2020-12-13: 1000 mL via INTRAVENOUS

## 2020-12-13 NOTE — ED Notes (Signed)
Patient turned to left side and became nauseated.

## 2020-12-13 NOTE — ED Triage Notes (Signed)
Patient arrives via [facility] from home with c/o possible vertigo which worsens when laying on left side since 7pm. Patient had vomiting episode with EMS. Patient did not take any medication before arriving to the ED.

## 2020-12-13 NOTE — ED Notes (Signed)
Ambulated with patient to the bathroom, gait steady.

## 2020-12-13 NOTE — ED Provider Notes (Signed)
[facility] EMERGENCY DEPT Provider Note   CSN: [id number] Arrival date & time: 12/13/20  5732     History Chief Complaint  Patient presents with   Dizziness    Dana Burton is a 75 y.o. female.  HPI     This is a 75 year old female with history of cancer, hyperlipidemia who presents with dizziness and vomiting.  Patient reports that 2 weeks ago she twisted her knee.  Since that time she has had persistent vertiginous symptoms.  No prior history of vertigo.  She states her dizziness and room spinning is worse when she lays on her left side.  She was going to wait till she had follow-up with her primary physician this coming week but overnight developed emesis.  Emesis was nonbilious and nonbloody.  She denies any weakness, numbness, strokelike symptoms.  She has not taken anything for her symptoms at home.  Denies chest pain or shortness of breath.   Past Medical History:  Diagnosis Date   Cancer ([place])    Hyperlipidemia    Osteoporosis    Wears contact lenses    to bring glasses    There are no problems to display for this patient.   Past Surgical History:  Procedure Laterality Date   BREAST LUMPECTOMY WITH NEEDLE LOCALIZATION Left 02/08/2014   Procedure: BREAST LUMPECTOMY WITH NEEDLE LOCALIZATION;  Surgeon: Edward Jolly, MD;  Location: [facility];  Service: General;  Laterality: Left;   BREAST SURGERY  1985   lt br bx-neg   COLONOSCOPY     ORIF HIP FRACTURE  2008   total hip post fx-right   PATELLA FRACTURE SURGERY  2005   rt     OB History   No obstetric history on file.     Family History  Problem Relation Age of Onset   Hypertension Father     Social History   Tobacco Use   Smoking status: Never  Substance Use Topics   Alcohol use: No    Comment: very rare   Drug use: No    Home Medications Prior to Admission medications   Medication Sig Start Date End Date Taking? Authorizing Provider  calcium  citrate-vitamin D (CITRACAL+D) 315-200 MG-UNIT per tablet Take 1 tablet by mouth daily.    [provider]  CRESTOR 20 MG tablet  01/01/14   [provider]  Multiple Vitamins-Minerals (MULTIVITAMIN WITH MINERALS) tablet Take 1 tablet by mouth daily.    [provider]    Allergies    Codeine  Review of Systems   Review of Systems  Constitutional:  Negative for fever.  Respiratory:  Negative for shortness of breath.   Cardiovascular:  Negative for chest pain.  Gastrointestinal:  Positive for nausea and vomiting. Negative for abdominal pain.  Neurological:  Positive for dizziness. Negative for headaches.  All other systems reviewed and are negative.  Physical Exam Updated Vital Signs BP (!) 179/96 (BP Location: Right Arm)   Pulse 75   Temp 98 F (36.7 C) (Oral)   Resp 20   Ht 1.575 m (5\' 2" )   Wt 68.9 kg   SpO2 98%   BMI 27.80 kg/m   Physical Exam Vitals and nursing note reviewed.  Constitutional:      Appearance: She is well-developed.     Comments: Uncomfortable appearing, laying on left side  HENT:     Head: Normocephalic and atraumatic.     Nose: Nose normal.     Mouth/Throat:  Mouth: Mucous membranes are moist.  Eyes:     Pupils: Pupils are equal, round, and reactive to light.     Comments: No nystagmus noted  Cardiovascular:     Rate and Rhythm: Normal rate and regular rhythm.     Heart sounds: Normal heart sounds.  Pulmonary:     Effort: Pulmonary effort is normal. No respiratory distress.     Breath sounds: No wheezing.  Abdominal:     Palpations: Abdomen is soft.     Tenderness: There is no abdominal tenderness.  Musculoskeletal:     Cervical back: Neck supple.     Right lower leg: No edema.     Left lower leg: No edema.  Skin:    General: Skin is warm and dry.  Neurological:     Mental Status: She is alert and oriented to person, place, and time.     Comments: Fluent speech, patient oriented, cranial nerves II through  XII intact, unable to do finger-nose-finger or heel shin secondary to patient discomfort.  Psychiatric:        Mood and Affect: Mood normal.    ED Results / Procedures / Treatments   Labs (all labs ordered are listed, but only abnormal results are displayed) Labs Reviewed  CBC WITH DIFFERENTIAL/PLATELET  BASIC METABOLIC PANEL    EKG EKG Interpretation  Date/Time:  Tuesday December 13 2020 04:53:16 EDT Ventricular Rate:  70 PR Interval:  159 QRS Duration: 91 QT Interval:  421 QTC Calculation: 455 R Axis:   -39 Text Interpretation: Sinus rhythm Abnormal R-wave progression, late transition Inferior infarct, old Confirmed by Thayer Jew [phone]) on 12/13/2020 5:23:38 AM  Radiology CT Head Wo Contrast  Result Date: 12/13/2020 CLINICAL DATA:  Vertigo worsening when laying on the left side, emesis EXAM: CT HEAD WITHOUT CONTRAST TECHNIQUE: Contiguous axial images were obtained from the base of the skull through the vertex without intravenous contrast. COMPARISON:  None. FINDINGS: Brain: No evidence of acute infarction, hemorrhage, hydrocephalus, extra-axial collection, visible mass lesion or mass effect. Midline intracranial structures are unremarkable. Cerebellar tonsils are normally positioned. Basal cisterns are patent. Vascular: Atherosclerotic calcification of the carotid siphons. No hyperdense vessel. Skull: No calvarial fracture or suspicious osseous lesion. No scalp swelling or hematoma. Sinuses/Orbits: Paranasal sinuses and mastoid air cells are predominantly clear. Pneumatization of the petrous apices. Middle ear cavities are clear. Other: None. IMPRESSION: No acute intracranial abnormality. Intracranial atherosclerosis. Electronically Signed   By: Lovena Le M.D.   On: 12/13/2020 04:51    Procedures Procedures   Medications Ordered in ED Medications  ondansetron (ZOFRAN) injection 4 mg (has no administration in time range)  meclizine (ANTIVERT) tablet 25 mg (has no  administration in time range)  sodium chloride 0.9 % bolus 1,000 mL (1,000 mLs Intravenous New Bag/Given 12/13/20 0514)    ED Course  I have reviewed the triage vital signs and the nursing notes.  Pertinent labs & imaging results that were available during my care of the patient were reviewed by me and considered in my medical decision making (see chart for details).  Clinical Course as of 12/13/20 2342  Tue Dec 13, 2020  0634 Patient overall improved on recheck.  Reports improvement of his nausea and dizziness.  No dysmetria to finger-nose-finger.  No cerebellar signs.  Will attempt to ambulate. [CH]    Clinical Course User Index [CH] Tyah Acord, Barbette Hair, MD   MDM Rules/Calculators/A&P  Patient presents with vertiginous symptoms.  She states she has had these for the last 2 weeks.  No prior history prior to that.  She is nontoxic and vital signs are notable for elevated blood pressure.  Initially was difficult to examine as she was uncomfortable and vomiting.  However, repeat examination, she has no central cerebellar signs.  Patient was treated for peripheral vertigo.  Given duration of symptoms, I did obtain some basic labs and a head CT which were reassuring.  Initially patient was given meclizine, Zofran, and fluids.  She had some persistent nausea but improved dizziness.  She was repeated with Phenergan.  If patient is able to ambulate and improves, feel she can be discharged with symptom control.  After history, exam, and medical workup I feel the patient has been appropriately medically screened and is safe for discharge home. Pertinent diagnoses were discussed with the patient. Patient was given return precautions.  Final Clinical Impression(s) / ED Diagnoses Final diagnoses:  Vertigo    Rx / DC Orders ED Discharge Orders     None        Lochlann Mastrangelo, Barbette Hair, MD 12/13/20 2343

## 2020-12-13 NOTE — ED Provider Notes (Signed)
  Physical Exam  BP (!) 162/79 (BP Location: Right Arm)   Pulse 73   Temp 98 F (36.7 C) (Oral)   Resp (!) 22   Ht 1.575 m (5\' 2" )   Wt 68.9 kg   SpO2 98%   BMI 27.80 kg/m   Physical Exam  ED Course/Procedures   Clinical Course as of 12/14/20 [place] Dec 13, 2020  0634 Patient overall improved on recheck.  Reports improvement of his nausea and dizziness.  No dysmetria to finger-nose-finger.  No cerebellar signs.  Will attempt to ambulate. [CH]    Clinical Course User Index [CH] Horton, Barbette Hair, MD    Procedures  MDM    Patient seen and evaluated by Dr. Dina Rich for dizziness.  She has had vertiginous symptoms for 2 weeks.  Head CT here without any evidence of stroke Patient receiving antivertigo medicines. Plan reassessment and discharged home symptoms are adequately controlled.    Pattricia Boss, MD 12/14/20 1023

## 2020-12-15 ENCOUNTER — Emergency Department (HOSPITAL_BASED_OUTPATIENT_CLINIC_OR_DEPARTMENT_OTHER)
Admission: EM | Admit: 2020-12-15 | Discharge: 2020-12-15 | Disposition: A | Discharge status: Home / Self Care | Payer: Medicare HMO | Attending: Emergency Medicine | Admitting: Emergency Medicine

## 2020-12-15 ENCOUNTER — Encounter (HOSPITAL_BASED_OUTPATIENT_CLINIC_OR_DEPARTMENT_OTHER): Payer: Self-pay

## 2020-12-15 ENCOUNTER — Other Ambulatory Visit: Payer: Self-pay

## 2020-12-15 DIAGNOSIS — Z859 Personal history of malignant neoplasm, unspecified: Secondary | ICD-10-CM | POA: Insufficient documentation

## 2020-12-15 DIAGNOSIS — R112 Nausea with vomiting, unspecified: Secondary | ICD-10-CM | POA: Diagnosis not present

## 2020-12-15 DIAGNOSIS — R42 Dizziness and giddiness: Secondary | ICD-10-CM

## 2020-12-15 LAB — BASIC METABOLIC PANEL
Anion gap: 10 (ref 5–15)
BUN: 13 mg/dL (ref 8–23)
CO2: 26 mmol/L (ref 22–32)
Calcium: 9.3 mg/dL (ref 8.9–10.3)
Chloride: 97 mmol/L — ABNORMAL LOW (ref 98–111)
Creatinine, Ser: 0.49 mg/dL (ref 0.44–1.00)
GFR, Estimated: >60 mL/min (ref >60)
Glucose, Bld: 124 mg/dL — ABNORMAL HIGH (ref 70–99)
Potassium: 3.5 mmol/L (ref 3.5–5.1)
Sodium: 133 mmol/L — ABNORMAL LOW (ref 135–145)

## 2020-12-15 LAB — CBC
HCT: 47.9 % — ABNORMAL HIGH (ref 36.0–46.0)
Hemoglobin: 15.9 g/dL — ABNORMAL HIGH (ref 12.0–15.0)
MCH: 27.6 pg (ref 26.0–34.0)
MCHC: 33.2 g/dL (ref 30.0–36.0)
MCV: 83 fL (ref 80.0–100.0)
Platelets: 469 10*3/uL — ABNORMAL HIGH (ref 150–400)
RBC: 5.77 MIL/uL — ABNORMAL HIGH (ref 3.87–5.11)
RDW: 12.6 % (ref 11.5–15.5)
WBC: 9.1 10*3/uL (ref 4.0–10.5)
nRBC: 0 % (ref 0.0–0.2)

## 2020-12-15 MED ORDER — ONDANSETRON HCL 4 MG/2ML IJ SOLN
4.0000 mg | Freq: Once | INTRAMUSCULAR | Status: AC
  Administered 2020-12-15: 4 mg via INTRAVENOUS
  Filled 2020-12-15: qty 2

## 2020-12-15 MED ORDER — ONDANSETRON HCL 4 MG PO TABS: 4.0000 mg | ORAL_TABLET | Freq: Four times a day (QID) | ORAL | 0 refills | Status: DC

## 2020-12-15 MED ORDER — ONDANSETRON HCL 4 MG PO TABS: 4.0000 mg | ORAL_TABLET | Freq: Four times a day (QID) | ORAL | 0 refills | Status: AC

## 2020-12-15 MED ORDER — DIAZEPAM 2 MG PO TABS: 2.0000 mg | ORAL_TABLET | Freq: Four times a day (QID) | ORAL | 0 refills | Status: AC | PRN

## 2020-12-15 MED ORDER — SODIUM CHLORIDE 0.9 % IV BOLUS
500.0000 mL | Freq: Once | INTRAVENOUS | Status: AC
  Administered 2020-12-15: 500 mL via INTRAVENOUS

## 2020-12-15 MED ORDER — LORAZEPAM 2 MG/ML IJ SOLN
0.5000 mg | Freq: Once | INTRAMUSCULAR | Status: AC
  Administered 2020-12-15: 0.5 mg via INTRAVENOUS
  Filled 2020-12-15: qty 1

## 2020-12-15 MED ORDER — DIAZEPAM 2 MG PO TABS: 2.0000 mg | ORAL_TABLET | Freq: Four times a day (QID) | ORAL | 0 refills | Status: DC | PRN

## 2020-12-15 NOTE — ED Provider Notes (Addendum)
[facility] EMERGENCY DEPT Provider Note   CSN: [id number] Arrival date & time: 12/15/20  0236     History Chief Complaint  Patient presents with   Nausea   Vomiting    Dana Burton is a 75 y.o. female.  Returns to the emergency department with persistent vertigo symptoms.  Patient seen in the ED 2 days ago for same.  She reports that since going home she has been persistently experiencing vertigo symptoms, especially when she lays on her left side.  She has been using the meclizine but it has not been helping.  She has had persistent nausea and vomiting secondary to the dizziness.  No headache.      Past Medical History:  Diagnosis Date   Cancer ([place])    Hyperlipidemia    Osteoporosis    Wears contact lenses    to bring glasses    There are no problems to display for this patient.   Past Surgical History:  Procedure Laterality Date   BREAST LUMPECTOMY WITH NEEDLE LOCALIZATION Left 02/08/2014   Procedure: BREAST LUMPECTOMY WITH NEEDLE LOCALIZATION;  Surgeon: Edward Jolly, MD;  Location: [facility];  Service: General;  Laterality: Left;   BREAST SURGERY  1985   lt br bx-neg   COLONOSCOPY     ORIF HIP FRACTURE  2008   total hip post fx-right   PATELLA FRACTURE SURGERY  2005   rt     OB History   No obstetric history on file.     Family History  Problem Relation Age of Onset   Hypertension Father     Social History   Tobacco Use   Smoking status: Never  Substance Use Topics   Alcohol use: No    Comment: very rare   Drug use: No    Home Medications Prior to Admission medications   Medication Sig Start Date End Date Taking? Authorizing Provider  diazepam (VALIUM) 2 MG tablet Take 1 tablet (2 mg total) by mouth every 6 (six) hours as needed (dizziness/vertigo). 12/15/20  Yes Ana Liaw, Gwenyth Allegra, MD  ondansetron (ZOFRAN) 4 MG tablet Take 1 tablet (4 mg total) by mouth every 6 (six) hours. 12/15/20  Yes Natelie Ostrosky,  Gwenyth Allegra, MD  calcium citrate-vitamin D (CITRACAL+D) 315-200 MG-UNIT per tablet Take 1 tablet by mouth daily.    [provider]  CRESTOR 20 MG tablet  01/01/14   [provider]  meclizine (ANTIVERT) 12.5 MG tablet Take 1 tablet (12.5 mg total) by mouth 3 (three) times daily as needed for dizziness. 12/13/20   Pattricia Boss, MD  Multiple Vitamins-Minerals (MULTIVITAMIN WITH MINERALS) tablet Take 1 tablet by mouth daily.    [provider]    Allergies    Codeine  Review of Systems   Review of Systems  Gastrointestinal:  Positive for nausea and vomiting.  Neurological:  Positive for dizziness.  All other systems reviewed and are negative.  Physical Exam Updated Vital Signs BP (!) 174/94 (BP Location: Right Arm)   Pulse 87   Temp 98.6 F (37 C) (Oral)   Resp 16   Ht 5\' 4"  (1.626 m)   Wt 65.8 kg   SpO2 95%   BMI 24.89 kg/m   Physical Exam Vitals and nursing note reviewed.  Constitutional:      General: She is not in acute distress.    Appearance: Normal appearance. She is well-developed.  HENT:     Head: Normocephalic and atraumatic.     Right  Ear: Hearing normal.     Left Ear: Hearing normal.     Nose: Nose normal.  Eyes:     Conjunctiva/sclera: Conjunctivae normal.     Pupils: Pupils are equal, round, and reactive to light.  Cardiovascular:     Rate and Rhythm: Regular rhythm.     Heart sounds: S1 normal and S2 normal. No murmur heard.   No friction rub. No gallop.  Pulmonary:     Effort: Pulmonary effort is normal. No respiratory distress.     Breath sounds: Normal breath sounds.  Chest:     Chest wall: No tenderness.  Abdominal:     General: Bowel sounds are normal.     Palpations: Abdomen is soft.     Tenderness: There is no abdominal tenderness. There is no guarding or rebound. Negative signs include Murphy's sign and McBurney's sign.     Hernia: No hernia is present.  Musculoskeletal:        General: Normal range of motion.      Cervical back: Normal range of motion and neck supple.  Skin:    General: Skin is warm and dry.     Findings: No rash.  Neurological:     Mental Status: She is alert and oriented to person, place, and time.     GCS: GCS eye subscore is 4. GCS verbal subscore is 5. GCS motor subscore is 6.     Cranial Nerves: No cranial nerve deficit.     Sensory: No sensory deficit.     Coordination: Coordination normal.     Comments: Extraocular muscle movement: normal No visual field cut Pupils: equal and reactive both direct and consensual response is normal No nystagmus present    Sensory function is intact to light touch, pinprick Proprioception intact  Grip strength 5/5 symmetric in upper extremities No pronator drift Normal finger to nose bilaterally  Lower extremity strength 5/5 against gravity Normal heel to shin bilaterally    Psychiatric:        Speech: Speech normal.        Behavior: Behavior normal.        Thought Content: Thought content normal.    ED Results / Procedures / Treatments   Labs (all labs ordered are listed, but only abnormal results are displayed) Labs Reviewed  CBC - Abnormal; Notable for the following components:      Result Value   RBC 5.77 (*)    Hemoglobin 15.9 (*)    HCT 47.9 (*)    Platelets 469 (*)    All other components within normal limits  BASIC METABOLIC PANEL - Abnormal; Notable for the following components:   Sodium 133 (*)    Chloride 97 (*)    Glucose, Bld 124 (*)    All other components within normal limits    EKG EKG Interpretation  Date/Time:  Thursday December 15 2020 04:39:14 EDT Ventricular Rate:  85 PR Interval:  141 QRS Duration: 86 QT Interval:  387 QTC Calculation: 461 R Axis:   -60 Text Interpretation: Sinus rhythm Inferior infarct, old Consider anterior infarct Confirmed by Orpah Greek [phone]) on 12/15/2020 4:43:37 AM  Radiology No results found.  Procedures Procedures   Medications Ordered in  ED Medications  sodium chloride 0.9 % bolus 500 mL (0 mLs Intravenous Stopped 12/15/20 0418)  ondansetron (ZOFRAN) injection 4 mg (4 mg Intravenous Given 12/15/20 0308)  LORazepam (ATIVAN) injection 0.5 mg (0.5 mg Intravenous Given 12/15/20 0308)    ED Course  I  have reviewed the triage vital signs and the nursing notes.  Pertinent labs & imaging results that were available during my care of the patient were reviewed by me and considered in my medical decision making (see chart for details).    MDM Rules/Calculators/A&P                          Patient presents to the emergency department for evaluation of dizziness with nausea and vomiting.  Patient was seen in the emergency department 2 days ago with vertigo.  Symptoms seemed most consistent with benign positional vertigo.  Since going home, however, she reports that the meclizine has not been helping.  She feels a little bit better with her dizziness but is still experiencing some nausea, vomiting from the dizziness.  She is still experiencing symptoms when she lays on her left side, not on her right.  Patient's neurologic exam is normal at arrival.  Patient was given a small fluid bolus and Ativan and has had resolution of her symptoms.  This does support diagnosis of peripheral vertigo.  Will discharge with low-dose oral Valium, follow-up with ENT.  Final Clinical Impression(s) / ED Diagnoses Final diagnoses:  Vertigo    Rx / DC Orders ED Discharge Orders          Ordered    diazepam (VALIUM) 2 MG tablet  Every 6 hours PRN        12/15/20 0442    ondansetron (ZOFRAN) 4 MG tablet  Every 6 hours        12/15/20 0442             Orpah Greek, MD 12/15/20 2248    Orpah Greek, MD 12/15/20 [phone]

## 2020-12-15 NOTE — ED Triage Notes (Signed)
Pt arrived via EMS for nausea and vomiting. Pt was recently seen and dx with vertigo. Pt states she has continued to be dizzy and nauseated.

## 2020-12-15 NOTE — ED Notes (Signed)
Ambulated pt to door and back to bed.

## 2022-03-30 ENCOUNTER — Other Ambulatory Visit: Payer: Self-pay | Admitting: Family Medicine

## 2022-03-30 DIAGNOSIS — M7989 Other specified soft tissue disorders: Secondary | ICD-10-CM

## 2022-04-02 ENCOUNTER — Ambulatory Visit
Admission: RE | Admit: 2022-04-02 | Discharge: 2022-04-02 | Disposition: A | Discharge status: Home / Self Care | Payer: Medicare HMO | Source: Ambulatory Visit | Attending: Family Medicine | Admitting: Family Medicine

## 2022-04-02 DIAGNOSIS — M7989 Other specified soft tissue disorders: Secondary | ICD-10-CM

## 2022-04-09 ENCOUNTER — Other Ambulatory Visit: Payer: Self-pay | Admitting: Family Medicine

## 2022-04-09 DIAGNOSIS — M7989 Other specified soft tissue disorders: Secondary | ICD-10-CM

## 2022-04-10 ENCOUNTER — Ambulatory Visit
Admission: RE | Admit: 2022-04-10 | Discharge: 2022-04-10 | Disposition: A | Discharge status: Home / Self Care | Payer: Medicare HMO | Source: Ambulatory Visit | Attending: Family Medicine | Admitting: Family Medicine

## 2022-04-10 DIAGNOSIS — M7989 Other specified soft tissue disorders: Secondary | ICD-10-CM

## 2022-04-10 MED ORDER — GADOBENATE DIMEGLUMINE 529 MG/ML IV SOLN
13.0000 mL | Freq: Once | INTRAVENOUS | Status: AC | PRN
  Administered 2022-04-10: 13 mL via INTRAVENOUS

## 2022-04-12 ENCOUNTER — Other Ambulatory Visit: Payer: Medicare HMO

## 2022-04-26 ENCOUNTER — Other Ambulatory Visit: Payer: Medicare HMO

## 2022-08-31 IMAGING — CT CT HEAD W/O CM
2 series · 15 of 30 positions shown, 17 images · non-contrast
Comparison: None.

CLINICAL DATA: Vertigo worsening when laying on the left side,
emesis

EXAM:
CT HEAD WITHOUT CONTRAST
TECHNIQUE: Contiguous axial images were obtained from the base of the skull
through the vertex without intravenous contrast.

[Series 3: head wo · axial · 0.49mm/px · z∈[-173,-48]mm · 7 of 35 slices shown, 9 images]
[im 5/35  brain]
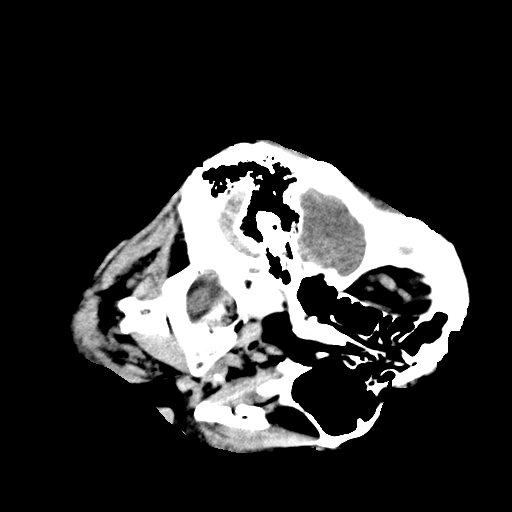
[im 5/35  bone]
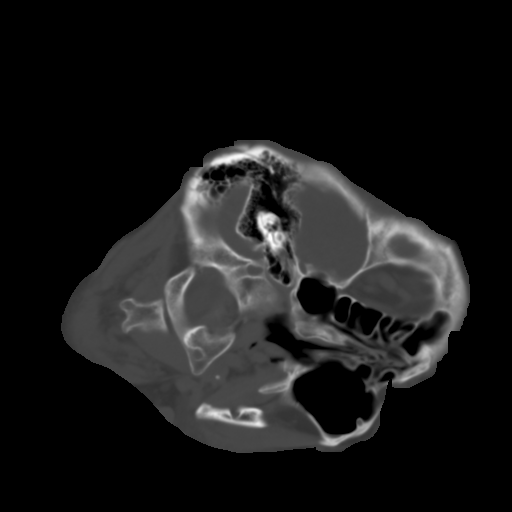
[im 9/35  brain]
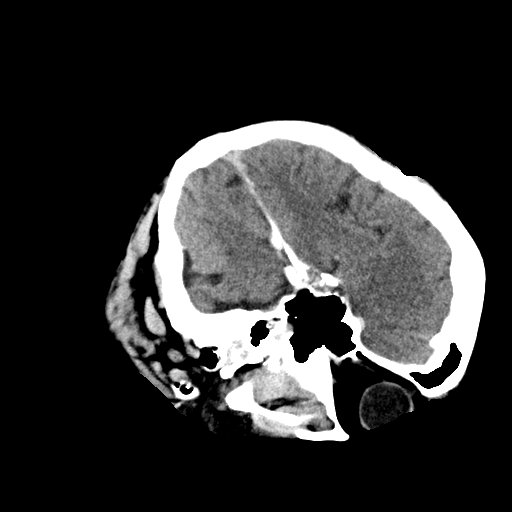
[im 13/35  brain]
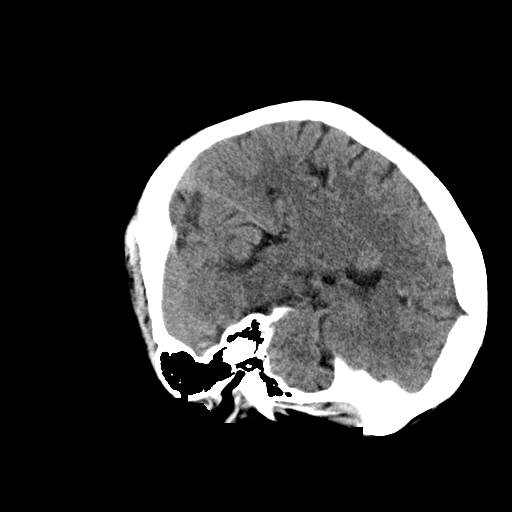
[im 18/35  brain]
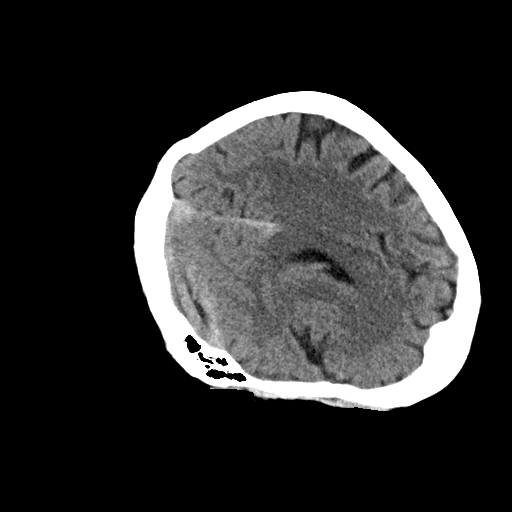
[im 22/35  brain]
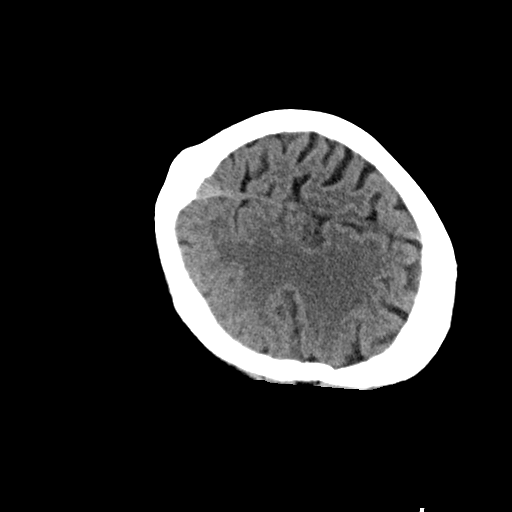
[im 22/35  bone]
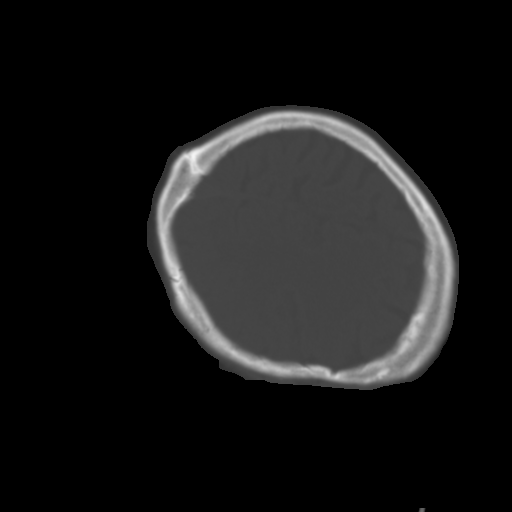
[im 26/35  brain]
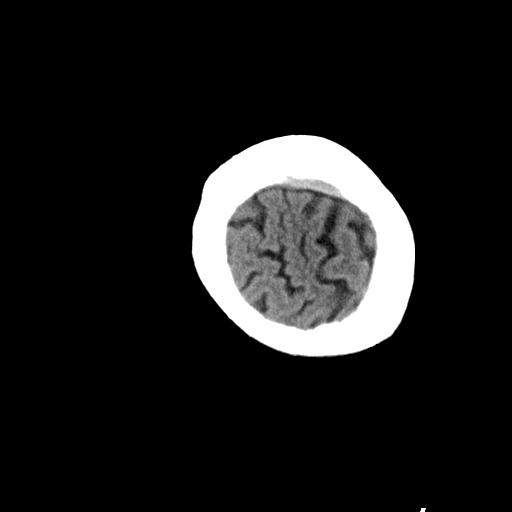
[im 30/35  brain]
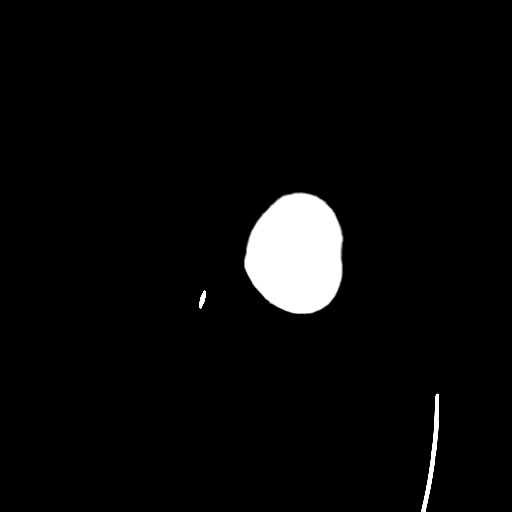

[Series 4: head bone · axial · 0.49mm/px · z∈[-177,-41]mm · 8 of 86 slices shown]
[im 9/86  bone]
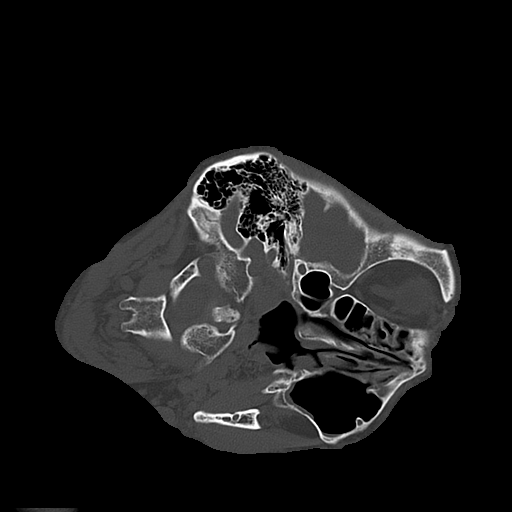
[im 18/86  bone]
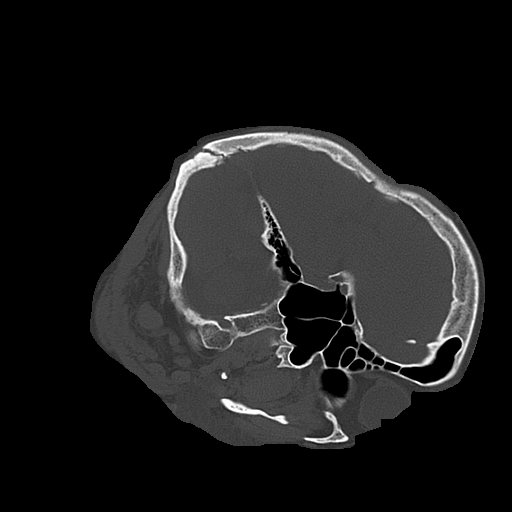
[im 26/86  bone]
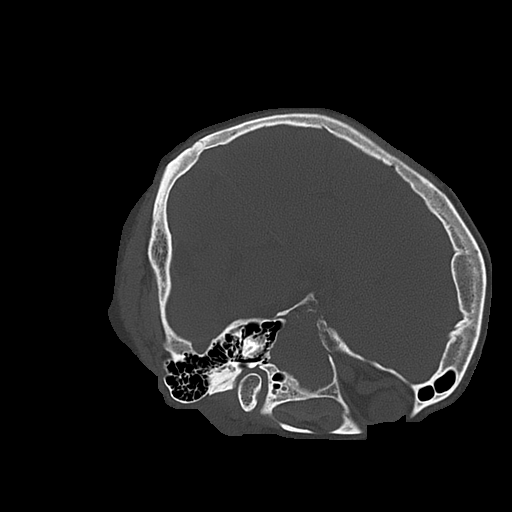
[im 39/86  bone]
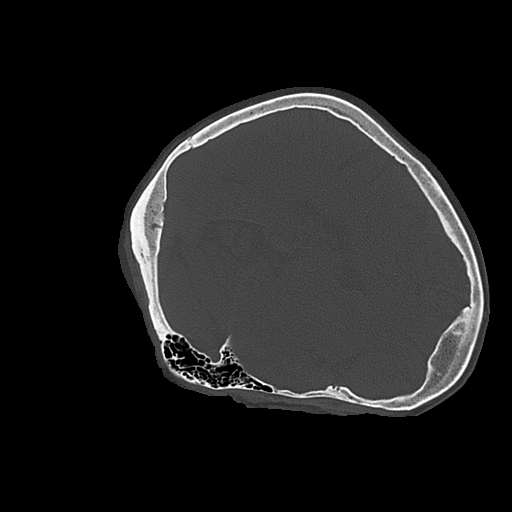
[im 47/86  bone]
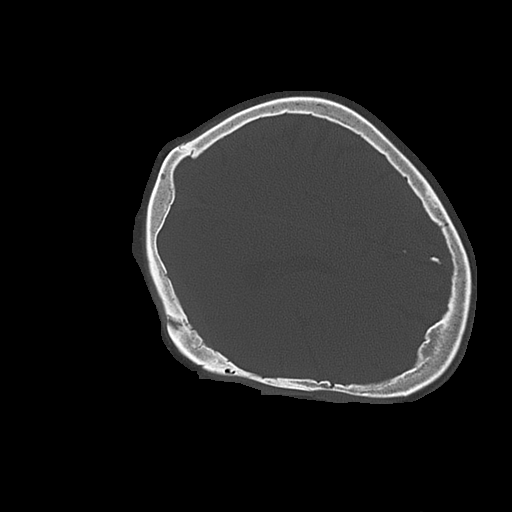
[im 60/86  bone]
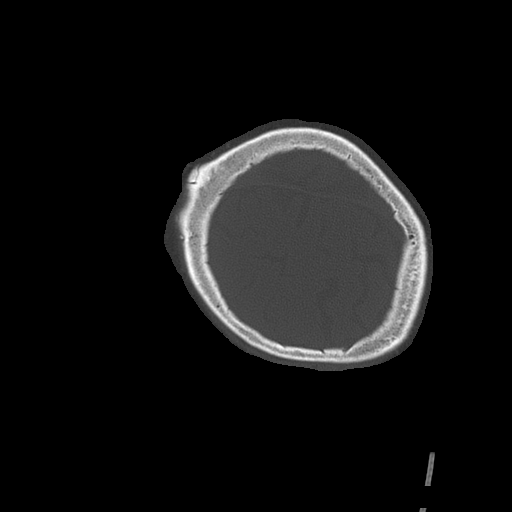
[im 69/86  bone]
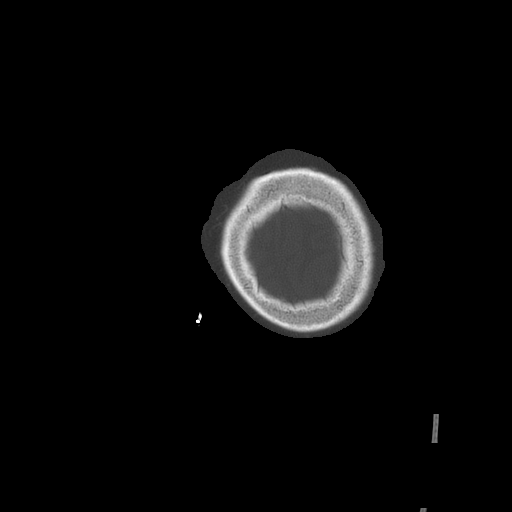
[im 77/86  bone]
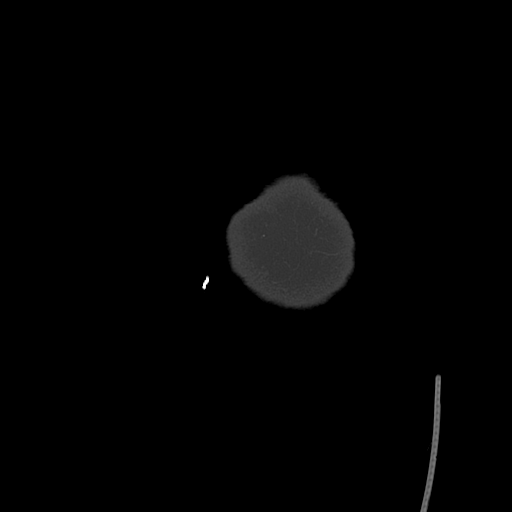

[15 of 30 positions shown; findings below may reference images not displayed]

FINDINGS: Brain: No evidence of acute infarction, hemorrhage, hydrocephalus,
extra-axial collection, visible mass lesion or mass effect. Midline
intracranial structures are unremarkable. Cerebellar tonsils are
normally positioned. Basal cisterns are patent.

Vascular: Atherosclerotic calcification of the carotid siphons. No
hyperdense vessel.

Skull: No calvarial fracture or suspicious osseous lesion. No scalp
swelling or hematoma.

Sinuses/Orbits: Paranasal sinuses and mastoid air cells are
predominantly clear. Pneumatization of the petrous apices. Middle
ear cavities are clear.

Other: None.
IMPRESSION: No acute intracranial abnormality.

Intracranial atherosclerosis.
